# Patient Record
Sex: Female | Born: 1960 | Race: White | Hispanic: No | Marital: Married | Smoking: Never smoker
Health system: Southern US, Community
[De-identification: ages and names within clinical notes are randomized; demographics above are authoritative.]

## PROBLEM LIST (undated history)

## (undated) DIAGNOSIS — G35 Multiple sclerosis: Secondary | ICD-10-CM

## (undated) DIAGNOSIS — G35D Multiple sclerosis, unspecified: Secondary | ICD-10-CM

---

## 2016-06-11 ENCOUNTER — Emergency Department (HOSPITAL_COMMUNITY)
Admission: EM | Admit: 2016-06-11 | Discharge: 2016-06-11 | Disposition: A | Discharge status: Home / Self Care | Payer: BLUE CROSS/BLUE SHIELD | Attending: Emergency Medicine | Admitting: Emergency Medicine

## 2016-06-11 ENCOUNTER — Encounter (HOSPITAL_COMMUNITY): Payer: Self-pay | Admitting: Emergency Medicine

## 2016-06-11 ENCOUNTER — Emergency Department (HOSPITAL_COMMUNITY): Payer: BLUE CROSS/BLUE SHIELD

## 2016-06-11 DIAGNOSIS — R0789 Other chest pain: Secondary | ICD-10-CM | POA: Diagnosis not present

## 2016-06-11 DIAGNOSIS — R079 Chest pain, unspecified: Secondary | ICD-10-CM | POA: Diagnosis present

## 2016-06-11 HISTORY — DX: Multiple sclerosis: G35

## 2016-06-11 HISTORY — DX: Multiple sclerosis, unspecified: G35.D

## 2016-06-11 LAB — BASIC METABOLIC PANEL
ANION GAP: 8 (ref 5–15)
BUN: 17 mg/dL (ref 6–20)
CALCIUM: 9.1 mg/dL (ref 8.9–10.3)
CHLORIDE: 103 mmol/L (ref 101–111)
CO2: 25 mmol/L (ref 22–32)
CREATININE: 0.65 mg/dL (ref 0.44–1.00)
GFR calc non Af Amer: >60 mL/min (ref >60)
GLUCOSE: 117 mg/dL — AB (ref 65–99)
POTASSIUM: 4.4 mmol/L (ref 3.5–5.1)
SODIUM: 136 mmol/L (ref 135–145)

## 2016-06-11 LAB — I-STAT TROPONIN, ED
Troponin i, poc: 0 ng/mL (ref 0.00–0.08)
Troponin i, poc: 0 ng/mL (ref 0.00–0.08)

## 2016-06-11 LAB — CBC
HCT: 40.7 % (ref 36.0–46.0)
HEMOGLOBIN: 13.4 g/dL (ref 12.0–15.0)
MCH: 29.7 pg (ref 26.0–34.0)
MCHC: 32.9 g/dL (ref 30.0–36.0)
MCV: 90.2 fL (ref 78.0–100.0)
PLATELETS: 271 10*3/uL (ref 150–400)
RBC: 4.51 MIL/uL (ref 3.87–5.11)
RDW: 13.4 % (ref 11.5–15.5)
WBC: 6.2 10*3/uL (ref 4.0–10.5)

## 2016-06-11 LAB — D-DIMER, QUANTITATIVE: D-Dimer, Quant: <0.27 ug/mL-FEU (ref 0.00–0.50)

## 2016-06-11 MED ORDER — NAPROXEN 500 MG PO TABS: 500.0000 mg | ORAL_TABLET | Freq: Two times a day (BID) | ORAL | 0 refills | Status: DC

## 2016-06-11 MED ORDER — SODIUM CHLORIDE 0.9 % IV BOLUS (SEPSIS)
1000.0000 mL | Freq: Once | INTRAVENOUS | Status: AC
  Administered 2016-06-11: 1000 mL via INTRAVENOUS

## 2016-06-11 MED ORDER — KETOROLAC TROMETHAMINE 30 MG/ML IJ SOLN
30.0000 mg | Freq: Once | INTRAMUSCULAR | Status: AC
  Administered 2016-06-11: 30 mg via INTRAVENOUS
  Filled 2016-06-11: qty 1

## 2016-06-11 MED ORDER — ACETAMINOPHEN 500 MG PO TABS
1000.0000 mg | ORAL_TABLET | Freq: Once | ORAL | Status: AC
  Administered 2016-06-11: 1000 mg via ORAL
  Filled 2016-06-11: qty 2

## 2016-06-11 NOTE — ED Notes (Signed)
Spoke to Dr. Criss Alvine about patient pain.  MD to see patient.

## 2016-06-11 NOTE — ED Provider Notes (Signed)
[facility]-EMERGENCY DEPT Provider Note   CSN: [id number] Arrival date & time: 06/11/16  1052     History   Chief Complaint Chief Complaint  Patient presents with  . Chest Pain    HPI Victoria Sawyer is a 56 y.o. female.  HPI  56 year old female presents with acute left-sided chest pain that started around 10:30 or 11 AM. She was brushing snow off her car with her right arm but did not exhibit significant exertion. When she sat down into her car she all of a sudden felt sharp left-sided chest pain. Worsened with deep inspiration. Called EMS and was brought here. There is no shortness of breath but the pain worsens when deep breathing. She does not think moving her arm makes it worse. It goes into her left shoulder and into her back. No leg swelling or leg pain recently. She has had a DVT once several years ago after hysterectomy. She has a history of multiple sclerosis and occasionally has pain from this in random areas. She has not exerted herself since starting this and is not sure if that makes it worse. No cough or recent illness. Has not taken anything for the pain. She has an aspirin allergy which causes of peripheral hemorrhage, but takes NSAIDs without difficulty. No HTN, HLD, DM, or CAD. Does not smoke. Dad had early coronary disease requiring stents. No recent travel.  Past Medical History:  Diagnosis Date  . MS (multiple sclerosis) (HCC)     There are no active problems to display for this patient.   History reviewed. No pertinent surgical history.  OB History    No data available       Home Medications    Prior to Admission medications   Medication Sig Start Date End Date Taking? Authorizing Provider  ADDERALL XR 10 MG 24 hr capsule TK ONE C PO QAM 05/12/16  Yes Historical Provider, MD  Dimethyl Fumarate (TECFIDERA) 240 MG CPDR Take 240 mg by mouth 2 (two) times daily.   Yes Historical Provider, MD  DULoxetine (CYMBALTA) 60 MG capsule TK 1 C PO QD 04/23/16  Yes  Historical Provider, MD  naproxen (NAPROSYN) 500 MG tablet Take 1 tablet (500 mg total) by mouth 2 (two) times daily with a meal. 06/11/16   Pricilla Loveless, MD    Family History History reviewed. No pertinent family history.  Social History Social History  Substance Use Topics  . Smoking status: Never Smoker  . Smokeless tobacco: Never Used  . Alcohol use No     Allergies   Aspirin   Review of Systems Review of Systems  Respiratory: Negative for shortness of breath.   Cardiovascular: Positive for chest pain. Negative for leg swelling.  Gastrointestinal: Negative for vomiting.  Musculoskeletal: Positive for back pain.  All other systems reviewed and are negative.    Physical Exam Updated Vital Signs BP 135/56   Pulse 79   Temp 97.8 F (36.6 C) (Oral)   Resp 19   SpO2 97%   Physical Exam  Constitutional: She is oriented to person, place, and time. She appears well-developed and well-nourished.  obese  HENT:  Head: Normocephalic and atraumatic.  Right Ear: External ear normal.  Left Ear: External ear normal.  Nose: Nose normal.  Eyes: Right eye exhibits no discharge. Left eye exhibits no discharge.  Cardiovascular: Normal rate, regular rhythm and normal heart sounds.   Pulmonary/Chest: Effort normal and breath sounds normal. She exhibits tenderness.    Pain with deep inspiration. Not reproduced  with left shoulder movement  Abdominal: Soft. There is no tenderness.  Musculoskeletal: She exhibits no edema.  Neurological: She is alert and oriented to person, place, and time.  Skin: Skin is warm and dry.  Nursing note and vitals reviewed.    ED Treatments / Results  Labs (all labs ordered are listed, but only abnormal results are displayed) Labs Reviewed  BASIC METABOLIC PANEL - Abnormal; Notable for the following:       Result Value   Glucose, Bld 117 (*)    All other components within normal limits  CBC  D-DIMER, QUANTITATIVE (NOT AT [facility])  Rosezena Sensor, ED  I-STAT TROPOININ, ED    EKG  EKG Interpretation  Date/Time:  Thursday June 11 2016 11:02:49 EST Ventricular Rate:  74 PR Interval:    QRS Duration: 96 QT Interval:  381 QTC Calculation: 423 R Axis:   77 Text Interpretation:  Normal sinus rhythm no acute ST/T changes No old tracing to compare Confirmed by Winta Barcelo MD, Consuello Lassalle [phone]) on 06/11/2016 1:24:01 PM       Radiology Dg Chest 2 View  Result Date: 06/11/2016 CLINICAL DATA:  Sternal pain.  No prior . EXAM: CHEST  2 VIEW COMPARISON:  No recent prior. FINDINGS: Mediastinum and hilar structures normal. Lungs are clear. No focal infiltrate. No pleural effusion or pneumothorax. Degenerative changes thoracic spine. IMPRESSION: No acute cardiopulmonary disease. Electronically Signed   By: Maisie Fus  Register   On: 06/11/2016 11:20    Procedures Procedures (including critical care time)  Medications Ordered in ED Medications  sodium chloride 0.9 % bolus 1,000 mL (0 mLs Intravenous Stopped 06/11/16 1523)  ketorolac (TORADOL) 30 MG/ML injection 30 mg (30 mg Intravenous Given 06/11/16 1434)  acetaminophen (TYLENOL) tablet 1,000 mg (1,000 mg Oral Given 06/11/16 1659)     Initial Impression / Assessment and Plan / ED Course  I have reviewed the triage vital signs and the nursing notes.  Pertinent labs & imaging results that were available during my care of the patient were reviewed by me and considered in my medical decision making (see chart for details).  Clinical Course as of Jun 11 1708  Thu Jun 11, 2016  1351 Likely muscular in etiology. Will add on ddimer given age and prior DVT in past. Toradol for pain. Re-eval, likely delta troponin  [SG]  1459 D-dimer is negative. While she has had DVT in the past this was provoked by surgery and she has no signs of DVT at this time. No tachycardia. Her pleuritic pain is likely from a muscular pain. Plan for second troponin and if negative will discharge based on low (2) HEART  score.  [SG]    Clinical Course User Index [SG] Pricilla Loveless, MD    Chest pain appears to be muscular in etiology. 2 troponins are negative. ECG unremarkable. Suspicion for ACS is quite low. HEART score is 2. Low risk for PE and low suspicion for PE. D dimer is negative. Plan to discharge home with NSAIDs and Tylenol as well as heat and ice as needed. Strict return per cautions as well as need for PCP follow-up. Chest x-ray unremarkable.  Final Clinical Impressions(s) / ED Diagnoses   Final diagnoses:  Left-sided chest wall pain    New Prescriptions New Prescriptions   NAPROXEN (NAPROSYN) 500 MG TABLET    Take 1 tablet (500 mg total) by mouth 2 (two) times daily with a meal.     Pricilla Loveless, MD 06/11/16 1710

## 2016-06-11 NOTE — ED Notes (Signed)
Patient d/c'd self care.  F/U and medications reviewed.  Patient verbalized understanding. 

## 2016-06-11 NOTE — ED Notes (Signed)
Stopped fluids to pull Istat off of line.  Will attempt to pull in 15 minutes.

## 2016-06-11 NOTE — ED Triage Notes (Addendum)
Per EMS. Pt began to have L sternal pain after pushing snow off car. Increased with movement and deep inspiration. No nausea, diaphoresis, or dizziness. Pt does report she feels SOB.

## 2017-02-20 ENCOUNTER — Emergency Department (HOSPITAL_COMMUNITY): Payer: BLUE CROSS/BLUE SHIELD

## 2017-02-20 ENCOUNTER — Emergency Department (HOSPITAL_COMMUNITY)
Admission: EM | Admit: 2017-02-20 | Discharge: 2017-02-20 | Disposition: A | Discharge status: Home / Self Care | Payer: BLUE CROSS/BLUE SHIELD | Attending: Emergency Medicine | Admitting: Emergency Medicine

## 2017-02-20 ENCOUNTER — Encounter (HOSPITAL_COMMUNITY): Payer: Self-pay

## 2017-02-20 DIAGNOSIS — Z79899 Other long term (current) drug therapy: Secondary | ICD-10-CM | POA: Insufficient documentation

## 2017-02-20 DIAGNOSIS — M79671 Pain in right foot: Secondary | ICD-10-CM | POA: Diagnosis present

## 2017-02-20 MED ORDER — NAPROXEN 375 MG PO TABS: 375.0000 mg | ORAL_TABLET | Freq: Two times a day (BID) | ORAL | 0 refills | Status: AC

## 2017-02-20 NOTE — ED Provider Notes (Signed)
[facility]-EMERGENCY DEPT Provider Note   CSN: [id number] Arrival date & time: 02/20/17  1324     History   Chief Complaint Chief Complaint  Patient presents with  . Foot Pain    R    HPI Victoria Sawyer is a 56 y.o. female who presents to the ED with foot pain. The pain is in the right foot. Patient reports that she fell a couple weeks ago and her foot swelled at that time. Since then she has been applying ice, elevating and home management but the swelling and pain continues. The pain is on the dorsum of the foot and radiates to the right great toe.   The history is provided by the patient. No language interpreter was used.  Foot Pain  The current episode started more than 1 week ago. The problem occurs constantly. The problem has not changed since onset.The symptoms are aggravated by standing and walking. Nothing relieves the symptoms. She has tried a cold compress and rest for the symptoms. The treatment provided no relief.    Past Medical History:  Diagnosis Date  . MS (multiple sclerosis) (HCC)     There are no active problems to display for this patient.   History reviewed. No pertinent surgical history.  OB History    No data available       Home Medications    Prior to Admission medications   Medication Sig Start Date End Date Taking? Authorizing Provider  ADDERALL XR 10 MG 24 hr capsule TK ONE C PO QAM 05/12/16   [provider]  Dimethyl Fumarate (TECFIDERA) 240 MG CPDR Take 240 mg by mouth 2 (two) times daily.    [provider]  DULoxetine (CYMBALTA) 60 MG capsule TK 1 C PO QD 04/23/16   [provider]  naproxen (NAPROSYN) 375 MG tablet Take 1 tablet (375 mg total) by mouth 2 (two) times daily. 02/20/17   Janne Napoleon, NP    Family History History reviewed. No pertinent family history.  Social History Social History  Substance Use Topics  . Smoking status: Never Smoker  . Smokeless tobacco: Never Used  . Alcohol use No      Allergies   Aspirin   Review of Systems Review of Systems  HENT: Negative.   Musculoskeletal: Positive for arthralgias.       Right foot  Skin: Negative for wound.     Physical Exam Updated Vital Signs BP (!) 142/72 (BP Location: Right Arm)   Pulse 76   Temp 98.2 F (36.8 C) (Oral)   Resp 18   Ht  (1.702 m)   Wt 108.9 kg (240 lb)   SpO2 99%   BMI 37.59 kg/m   Physical Exam  Constitutional: She is oriented to person, place, and time. She appears well-developed and well-nourished. No distress.  HENT:  Head: Normocephalic and atraumatic.  Eyes: EOM are normal.  Neck: Neck supple.  Cardiovascular: Normal rate.   Pulmonary/Chest: Effort normal.  Musculoskeletal:       Right foot: There is tenderness and swelling. There is normal range of motion, no deformity and no laceration.  Pedal pulses 2+, adequate circulation. Tender on palpation to the dorsum of the right foot at the base of the great toe.   Neurological: She is alert and oriented to person, place, and time. No cranial nerve deficit.  Skin: Skin is warm and dry.  Psychiatric: She has a normal mood and affect. Her behavior is normal.  Nursing note and  vitals reviewed.    ED Treatments / Results  Labs (all labs ordered are listed, but only abnormal results are displayed) Labs Reviewed - No data to display   Radiology Dg Foot Complete Right  Result Date: 02/20/2017 CLINICAL DATA:  Right foot pain after fall 2 weeks ago. EXAM: RIGHT FOOT COMPLETE - 3+ VIEW COMPARISON:  None. FINDINGS: There is no evidence of fracture or dislocation. There is no evidence of arthropathy or other focal bone abnormality. Soft tissues are unremarkable. IMPRESSION: Normal right foot. Electronically Signed   By: Lupita Raider, M.D.   On: 02/20/2017 15:22    Procedures Procedures (including critical care time)  Medications Ordered in ED Medications - No data to display   Initial Impression / Assessment and Plan / ED  Course  I have reviewed the triage vital signs and the nursing notes.  Pertinent imaging results that were available during my care of the patient were reviewed by me and considered in my medical decision making (see chart for details).  56 y.o. female with right foot pain and swelling s/p fall 2 weeks ago stable for d/c without focal neuro deficits. Ace wrap, post op shoe, ice, elevation and NSAIDS. F/u with PCP, return precautions.   Final Clinical Impressions(s) / ED Diagnoses   Final diagnoses:  Foot pain, right    New Prescriptions New Prescriptions   NAPROXEN (NAPROSYN) 375 MG TABLET    Take 1 tablet (375 mg total) by mouth 2 (two) times daily.     Kerrie [place], [place] 02/20/17 1549    Arby Barrette, MD 02/20/17 1606

## 2017-02-20 NOTE — ED Triage Notes (Addendum)
Pt reports that she had a mechanical fall a couple of weeks ago and her R foot swelled up. She has been icing it and treating it at home, but the pain and swelling have not decrease. She reports pain across the top of her R foot and numbness in her R big toe. A&Ox4. Hx of MS. Ambulatory.

## 2020-10-08 ENCOUNTER — Other Ambulatory Visit: Payer: Self-pay

## 2020-10-08 ENCOUNTER — Emergency Department (HOSPITAL_COMMUNITY)
Admission: EM | Admit: 2020-10-08 | Discharge: 2020-10-09 | Disposition: A | Discharge status: Home / Self Care | Payer: BC Managed Care – PPO | Attending: Emergency Medicine | Admitting: Emergency Medicine

## 2020-10-08 ENCOUNTER — Encounter (HOSPITAL_COMMUNITY): Payer: Self-pay

## 2020-10-08 DIAGNOSIS — U071 COVID-19: Secondary | ICD-10-CM | POA: Diagnosis not present

## 2020-10-08 DIAGNOSIS — G35 Multiple sclerosis: Secondary | ICD-10-CM

## 2020-10-08 DIAGNOSIS — M791 Myalgia, unspecified site: Secondary | ICD-10-CM

## 2020-10-08 DIAGNOSIS — M7918 Myalgia, other site: Secondary | ICD-10-CM | POA: Diagnosis present

## 2020-10-08 DIAGNOSIS — R748 Abnormal levels of other serum enzymes: Secondary | ICD-10-CM

## 2020-10-08 LAB — CBC WITH DIFFERENTIAL/PLATELET
Abs Immature Granulocytes: 0.02 10*3/uL (ref 0.00–0.07)
Basophils Absolute: 0 10*3/uL (ref 0.0–0.1)
Basophils Relative: 1 %
Eosinophils Absolute: 0 10*3/uL (ref 0.0–0.5)
Eosinophils Relative: 1 %
HCT: 42.3 % (ref 36.0–46.0)
Hemoglobin: 13.5 g/dL (ref 12.0–15.0)
Immature Granulocytes: 0 %
Lymphocytes Relative: 18 %
Lymphs Abs: 1 10*3/uL (ref 0.7–4.0)
MCH: 29.3 pg (ref 26.0–34.0)
MCHC: 31.9 g/dL (ref 30.0–36.0)
MCV: 92 fL (ref 80.0–100.0)
Monocytes Absolute: 0.9 10*3/uL (ref 0.1–1.0)
Monocytes Relative: 16 %
Neutro Abs: 3.6 10*3/uL (ref 1.7–7.7)
Neutrophils Relative %: 64 %
Platelets: 255 10*3/uL (ref 150–400)
RBC: 4.6 MIL/uL (ref 3.87–5.11)
RDW: 13 % (ref 11.5–15.5)
WBC: 5.7 10*3/uL (ref 4.0–10.5)
nRBC: 0 % (ref 0.0–0.2)

## 2020-10-08 LAB — COMPREHENSIVE METABOLIC PANEL
ALT: 15 U/L (ref 0–44)
AST: 19 U/L (ref 15–41)
Albumin: 4.1 g/dL (ref 3.5–5.0)
Alkaline Phosphatase: 69 U/L (ref 38–126)
Anion gap: 7 (ref 5–15)
BUN: 13 mg/dL (ref 6–20)
CO2: 29 mmol/L (ref 22–32)
Calcium: 9.1 mg/dL (ref 8.9–10.3)
Chloride: 101 mmol/L (ref 98–111)
Creatinine, Ser: 0.75 mg/dL (ref 0.44–1.00)
GFR, Estimated: >60 mL/min (ref >60)
Glucose, Bld: 129 mg/dL — ABNORMAL HIGH (ref 70–99)
Potassium: 3.9 mmol/L (ref 3.5–5.1)
Sodium: 137 mmol/L (ref 135–145)
Total Bilirubin: 0.7 mg/dL (ref 0.3–1.2)
Total Protein: 7 g/dL (ref 6.5–8.1)

## 2020-10-08 MED ORDER — ACETAMINOPHEN 325 MG PO TABS
650.0000 mg | ORAL_TABLET | Freq: Once | ORAL | Status: AC
  Administered 2020-10-08: 650 mg via ORAL
  Filled 2020-10-08: qty 2

## 2020-10-08 MED ORDER — SODIUM CHLORIDE 0.9 % IV SOLN
1000.0000 mg | Freq: Once | INTRAVENOUS | Status: AC
  Administered 2020-10-09: 1000 mg via INTRAVENOUS
  Filled 2020-10-08: qty 8

## 2020-10-08 MED ORDER — KETOROLAC TROMETHAMINE 30 MG/ML IJ SOLN
30.0000 mg | Freq: Once | INTRAMUSCULAR | Status: AC
  Administered 2020-10-08: 30 mg via INTRAVENOUS
  Filled 2020-10-08: qty 1

## 2020-10-08 NOTE — ED Provider Notes (Signed)
[facility]-EMERGENCY DEPT Provider Note   CSN: [id number] Arrival date & time: 10/08/20  1949     History Chief Complaint  Patient presents with  . Shoulder Pain    Victoria Sawyer is a 60 y.o. female.  The history is provided by the patient.  Shoulder Pain She has history of multiple sclerosis and comes in complaining of shooting pains throughout her body.  She had onset yesterday of pain which started in the interscapular area which was sharp.  As time has gone on, pain has spread to where it seems like it is shooting everywhere in her body.  Pain is similar to what she has had with previous flares of multiple sclerosis, but also similar to what she has had with a previous episode of pancreatitis.  There has been some nausea but no vomiting.  She denies fever or chills.  She denies any cough or dyspnea.  She did take a home COVID test which was negative.  She tried applying ice and heat without any benefit.  She rates her pain at 9/10.  She denies any new numbness or weakness although she does feel like she is slightly more off balance than normal.  She is here for a conference, lives in [place].   Past Medical History:  Diagnosis Date  . MS (multiple sclerosis) (HCC)     There are no problems to display for this patient.   History reviewed. No pertinent surgical history.   OB History   No obstetric history on file.     History reviewed. No pertinent family history.  Social History   Tobacco Use  . Smoking status: Never Smoker  . Smokeless tobacco: Never Used  Substance Use Topics  . Alcohol use: No    Home Medications Prior to Admission medications   Medication Sig Start Date End Date Taking? Authorizing Provider  ADDERALL XR 10 MG 24 hr capsule TK ONE C PO QAM 05/12/16   [provider]  Dimethyl Fumarate (TECFIDERA) 240 MG CPDR Take 240 mg by mouth 2 (two) times daily.    [provider]  DULoxetine (CYMBALTA) 60 MG capsule  TK 1 C PO QD 04/23/16   [provider]  naproxen (NAPROSYN) 375 MG tablet Take 1 tablet (375 mg total) by mouth 2 (two) times daily. 02/20/17   Janne Napoleon, NP    Allergies    Aspirin  Review of Systems   Review of Systems  All other systems reviewed and are negative.   Physical Exam Updated Vital Signs BP (!) 141/69   Pulse 74   Temp 99.3 F (37.4 C) (Oral)   Resp 18   Ht 5\' 7"  (1.702 m)   Wt 85.3 kg   SpO2 96%   BMI 29.44 kg/m   Physical Exam Vitals and nursing note reviewed.   60 year old female, appears somewhat uncomfortable, but is in no acute distress. Vital signs are significant for borderline elevated blood pressure. Oxygen saturation is 96%, which is normal. Head is normocephalic and atraumatic. PERRLA, EOMI. Oropharynx is clear. Neck is nontender and supple without adenopathy or JVD. Back has mild tenderness diffusely. Lungs are clear without rales, wheezes, or rhonchi. Chest is nontender. Heart has regular rate and rhythm without murmur. Abdomen is soft, flat, nontender without masses or hepatosplenomegaly and peristalsis is normoactive. Extremities have no cyanosis or edema, full range of motion is present. Skin is warm and dry without rash. Neurologic: Mental status is normal, cranial nerves are  intact.  Strength is 5/5 in both arms and both legs.  Pinprick sensation is intact in all 4 extremities.  ED Results / Procedures / Treatments   Labs (all labs ordered are listed, but only abnormal results are displayed) Labs Reviewed  COMPREHENSIVE METABOLIC PANEL - Abnormal; Notable for the following components:      Result Value   Glucose, Bld 129 (*)    All other components within normal limits  RESP PANEL BY RT-PCR (FLU A&B, COVID) ARPGX2  CBC WITH DIFFERENTIAL/PLATELET   Procedures Procedures   Medications Ordered in ED Medications  methylPREDNISolone sodium succinate (SOLU-MEDROL) 1,000 mg in sodium chloride 0.9 % 50 mL IVPB (has no  administration in time range)  ketorolac (TORADOL) 30 MG/ML injection 30 mg (has no administration in time range)  acetaminophen (TYLENOL) tablet 650 mg (has no administration in time range)    ED Course  I have reviewed the triage vital signs and the nursing notes.  Pertinent lab results that were available during my care of the patient were reviewed by me and considered in my medical decision making (see chart for details).   MDM Rules/Calculators/A&P                         Generalized pain which may be an exacerbation of multiple sclerosis versus viral syndrome.  Labs ordered at triage are unremarkable.  Old records are reviewed confirming history of multiple sclerosis and pain involved with previous flares.  Patient expresses concern of possible pancreatitis, will check lipase level.  She will also be given high-dose methylprednisolone as well as trial of ketorolac and acetaminophen.  1:17 AM Pain has significantly improved with above-noted treatment.  Lipase has come back moderately elevated at 90.  This is not diagnostic for pancreatitis.  Will send for CT scan to evaluate for possible inflammation around the pancreas.  CT scan shows no evidence of pancreatitis.  Patient is discharged with prescription for prednisolone for additional 4 days of high-dose treatment.  She is to continue using over-the-counter NSAIDs and acetaminophen as needed.  I have recommended that she contact her neurologist who may adjust the medication regimen.  She is to return if symptoms are getting worse.  Final Clinical Impression(s) / ED Diagnoses Final diagnoses:  Myalgia  History of multiple sclerosis (HCC)  Elevated lipase    Rx / DC Orders ED Discharge Orders         Ordered    methylPREDNISolone (MEDROL) 32 MG tablet  Daily        10/09/20 0318           Dione Booze, MD 10/09/20 0321

## 2020-10-08 NOTE — ED Provider Notes (Signed)
Emergency Medicine Provider Triage Evaluation Note  Victoria Sawyer , a 60 y.o. female  was evaluated in triage.  Pt complains of right upper shoulder/back pain, stabbing, with body aches all over.  History of MS, last flare was 3 years ago.  She has not taken anything for pain, has tried ice and heat with little relief.  Denies any fevers, does endorse some chills.  She reports that she did have an MS flare that was similar to this 1 in the past.  Review of Systems  Positive: As above Negative: As above  Physical Exam  BP (!) 143/74 (BP Location: Left Arm)   Pulse 81   Temp 99.3 F (37.4 C) (Oral)   Resp 18   Ht 5\' 7"  (1.702 m)   Wt 85.3 kg   SpO2 97%   BMI 29.44 kg/m  Gen:   Awake, no distress   Resp:  Normal effort  MSK:   Moves extremities without difficulty  Other:    Medical Decision Making  Medically screening exam initiated at 8:29 PM.  Appropriate orders placed.  Dannika Hilgeman was informed that the remainder of the evaluation will be completed by another provider, this initial triage assessment does not replace that evaluation, and the importance of remaining in the ED until their evaluation is complete.     Dollene Cleveland, PA-C 10/08/20 2030    10/10/20, MD 10/08/20 [phone]

## 2020-10-08 NOTE — ED Triage Notes (Signed)
Pt c/o R shoulder/upper back pain described as a stabbing pain. Denies any injury, tried ice/heat at home without relief. Hx MS

## 2020-10-09 ENCOUNTER — Emergency Department (HOSPITAL_COMMUNITY): Payer: BC Managed Care – PPO

## 2020-10-09 ENCOUNTER — Telehealth (HOSPITAL_COMMUNITY): Payer: Self-pay | Admitting: Emergency Medicine

## 2020-10-09 LAB — RESP PANEL BY RT-PCR (FLU A&B, COVID) ARPGX2
Influenza A by PCR: NEGATIVE
Influenza B by PCR: NEGATIVE
SARS Coronavirus 2 by RT PCR: POSITIVE — AB

## 2020-10-09 LAB — LIPASE, BLOOD: Lipase: 90 U/L — ABNORMAL HIGH (ref 11–51)

## 2020-10-09 MED ORDER — METHYLPREDNISOLONE 32 MG PO TABS: 992.0000 mg | ORAL_TABLET | Freq: Every day | ORAL | 0 refills | Status: AC

## 2020-10-09 NOTE — Discharge Instructions (Signed)
You may take ibuprofen or naproxen as needed.  You may add acetaminophen to get additional pain relief.  Please contact your neurologist for specific instructions.  He may change the medication regimen that I have ordered.  Return if you are having any problems.

## 2020-10-09 NOTE — Telephone Encounter (Signed)
I received a call from an outpatient pharmacist regarding Dr. Reynolds Bowl prescription for methylprednisolone.  Patient was given a prescription for 992 mg of prednisolone daily.  Patient was given 124 tablets.  Dr. Preston Fleeting did indicated his note that he was giving high-dose methylprednisolone.  I am unable to determine if that was the actual dose that Dr. Preston Fleeting intended to prescribe.  I explained to the pharmacist that Dr. Preston Fleeting is a night doctor and will be available in the evening.  He will try to reach back a later time.

## 2020-10-10 ENCOUNTER — Telehealth (HOSPITAL_COMMUNITY): Payer: Self-pay

## 2022-02-23 IMAGING — CT CT ABD-PELV W/O CM
2 of 4 series · 16 of 46 positions shown, 18 images · non-contrast
Comparison: None available at time dictation

CLINICAL DATA: Pancreatitis suspected

EXAM:
CT ABDOMEN AND PELVIS WITHOUT CONTRAST
TECHNIQUE: Multidetector CT imaging of the abdomen and pelvis was performed
following the standard protocol without IV contrast.

[Series 2: axial st · axial · 0.71mm/px · z∈[+986,+1381]mm · 13 of 89 slices shown, 15 images]
[im 5/89  soft-tissue]
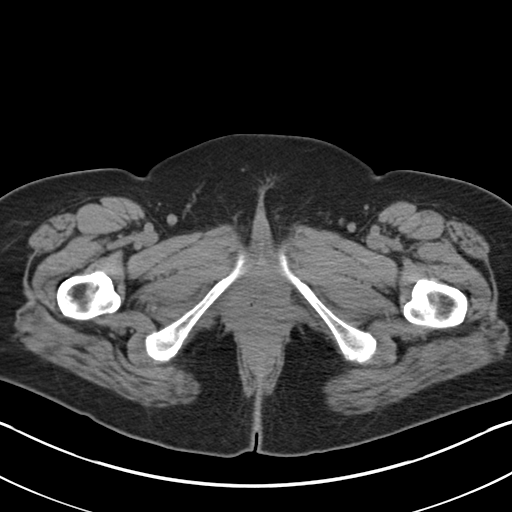
[im 5/89  bone]
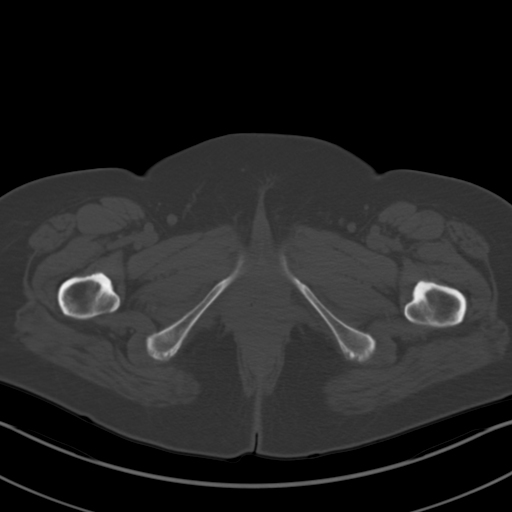
[im 14/89  soft-tissue]
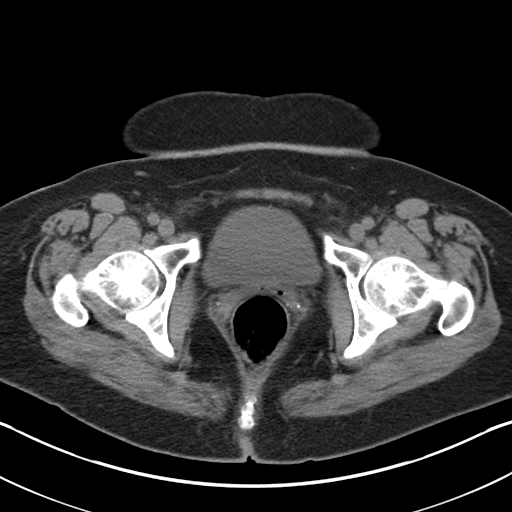
[im 19/89  soft-tissue]
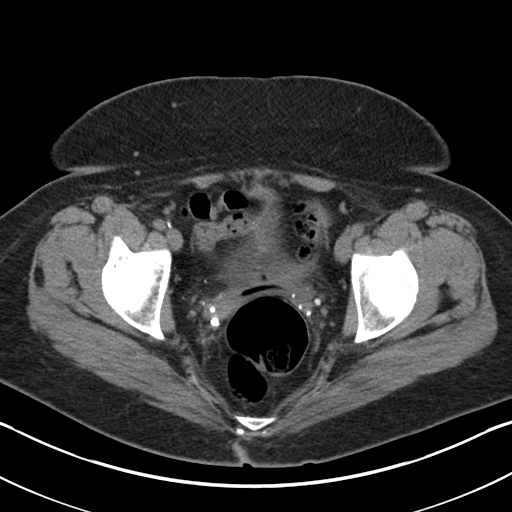
[im 24/89  soft-tissue]
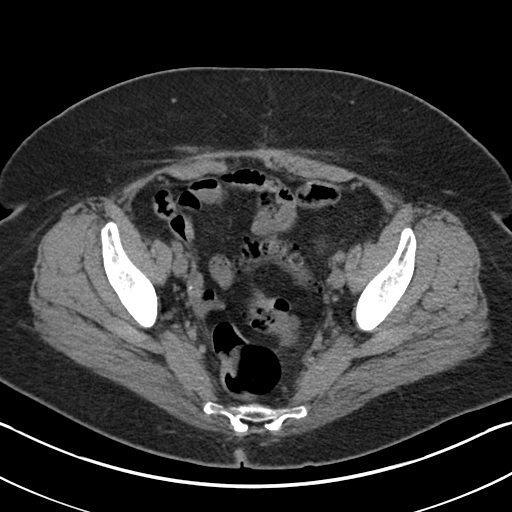
[im 33/89  soft-tissue]
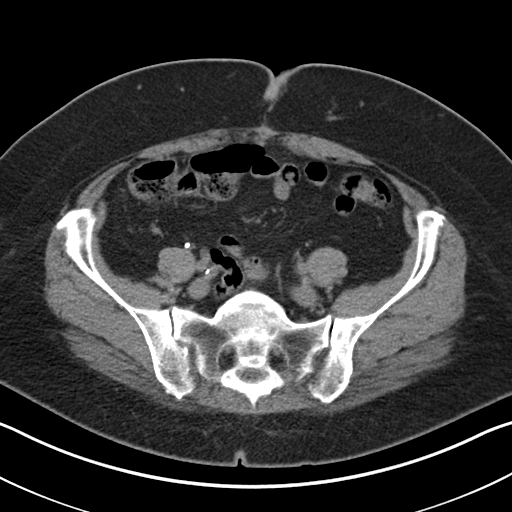
[im 38/89  soft-tissue]
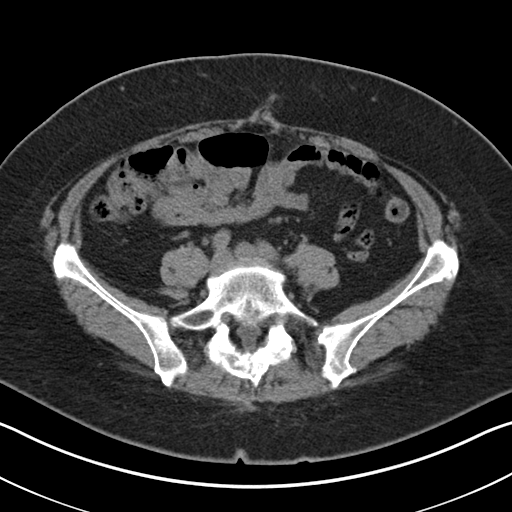
[im 47/89  soft-tissue]
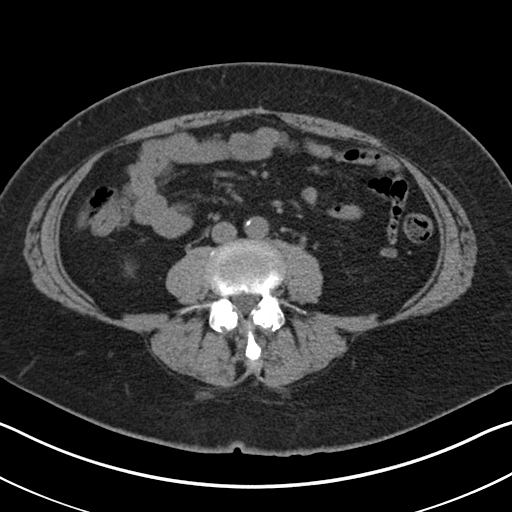
[im 51/89  soft-tissue]
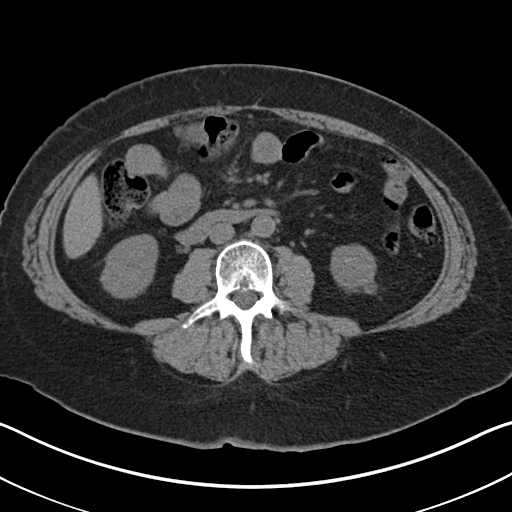
[im 56/89  soft-tissue]
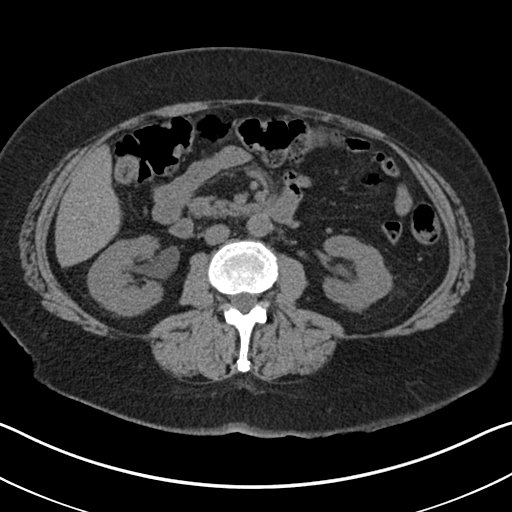
[im 56/89  bone]
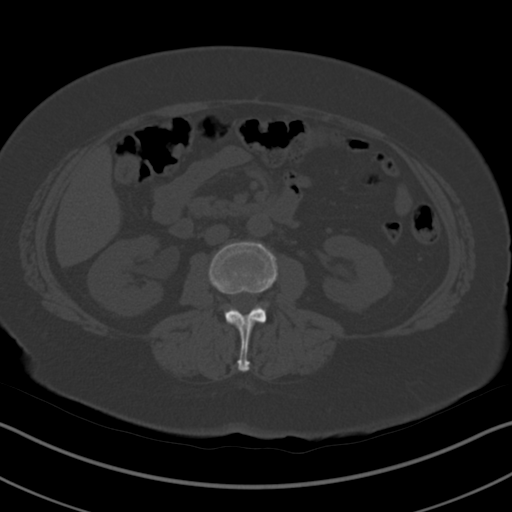
[im 65/89  soft-tissue]
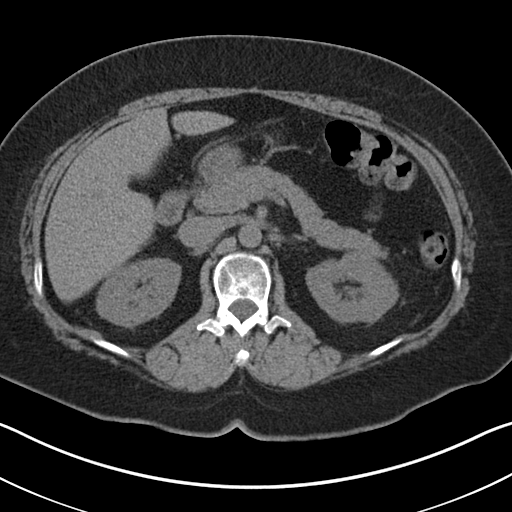
[im 70/89  soft-tissue]
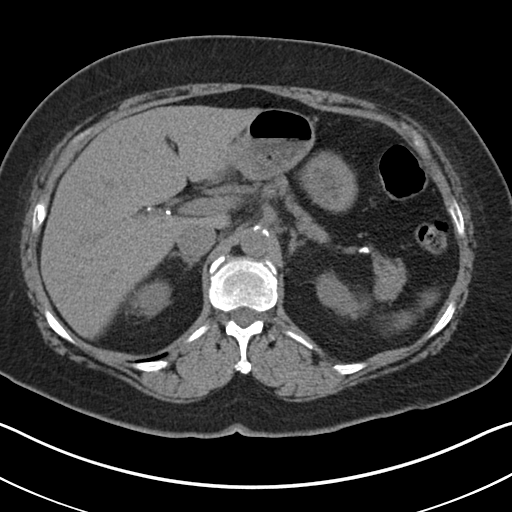
[im 75/89  soft-tissue]
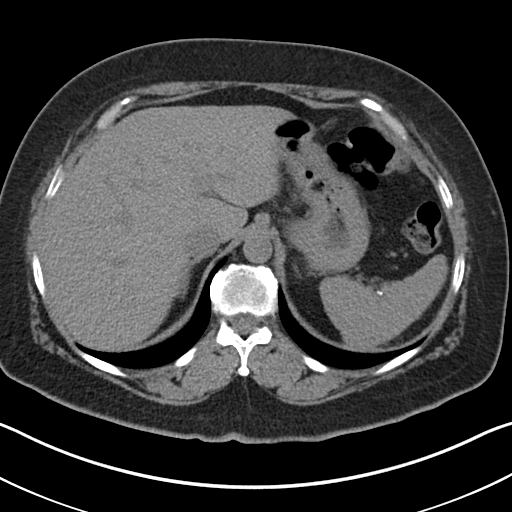
[im 84/89  soft-tissue]
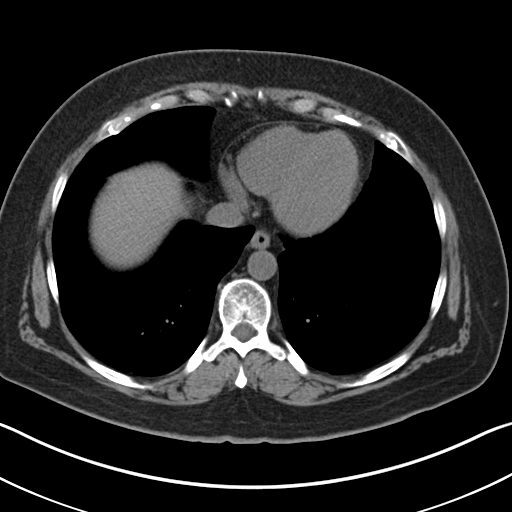

[Series 4: coronal st · coronal · 0.78mm/px · 3 of 142 slices shown]
[im 48/142  soft-tissue]
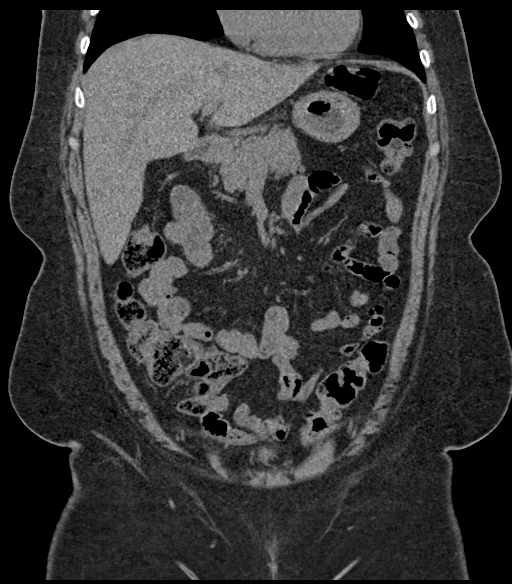
[im 63/142  soft-tissue]
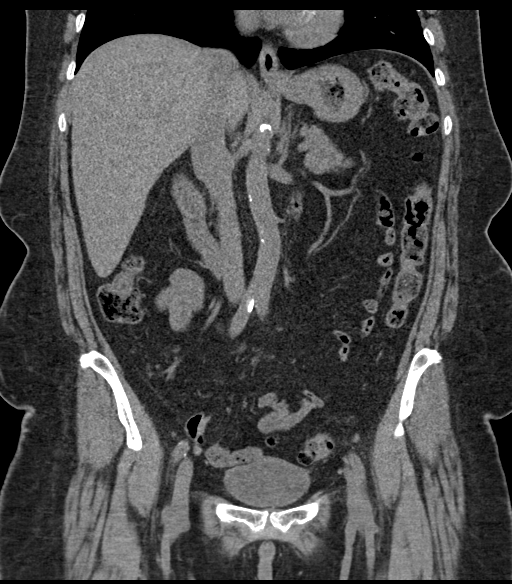
[im 79/142  soft-tissue]
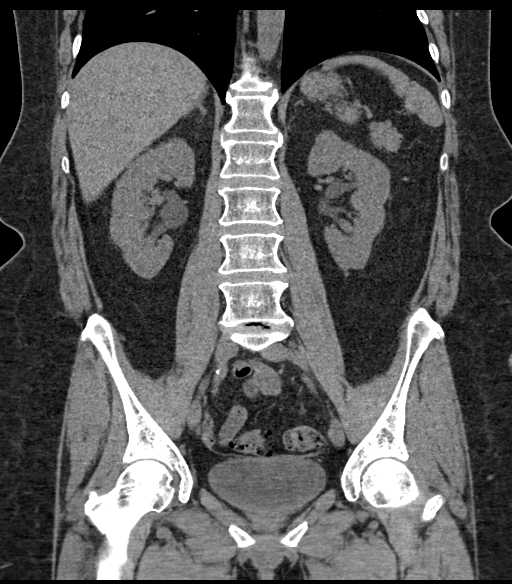

[16 of 46 positions shown; findings below may reference images not displayed]

FINDINGS: Lower chest: No acute abnormality. Normal size heart. No significant
pericardial effusion/thickening.

Hepatobiliary: Unremarkable noncontrast appearance of the hepatic
parenchyma. Gallbladder surgically absent. No biliary ductal
dilation.

Pancreas: Unremarkable. No pancreatic ductal dilatation or
surrounding inflammatory changes.

Spleen: Normal in size without focal abnormality.

Adrenals/Urinary Tract: Bilateral adrenal glands are unremarkable.

No hydronephrosis. Right kidneys unremarkable without
nephrolithiasis or contour deforming renal masses. There are 2
hyperdense exophytic lesions in the right kidney including a 1.4 cm
interpolar renal lesion on image [DATE] a 1.6 cm interpolar renal
lesion on image 32/2 and a 9 mm lesion on image 40/2, while these
may represent hemorrhagic/proteinaceous cysts they are incompletely
evaluated on this single-phase non contrasted CT.

Urinary bladder is grossly unremarkable for degree of distension.

Stomach/Bowel: Stomach is grossly unremarkable for degree of
distension. Normal positioning of the duodenum/ligament of Treitz.
No pathologic dilation of small bowel. Left-sided colonic
diverticulosis without findings of acute diverticulitis.

Vascular/Lymphatic: No significant vascular findings are present. No
enlarged abdominal or pelvic lymph nodes.

Reproductive: No suspicious lesions or masses.

Other: No abdominopelvic ascites.

Musculoskeletal: No acute osseous abnormality.
IMPRESSION: 1. No acute abdominopelvic findings. Specifically, no CT evidence of
acute pancreatitis.
2. There are 2 hyperdense exophytic lesions in the right kidney,
while these may represent hemorrhagic/proteinaceous cysts they are
incompletely evaluated on this single-phase non contrasted CT.
Further evaluation with nonemergent renal ultrasound is suggested.
3. Left-sided colonic diverticulosis without findings of acute
diverticulitis.
